# Patient Record
Sex: Female | Born: 1985 | Race: Black or African American | Hispanic: No | Marital: Single | Smoking: Never smoker
Health system: Southern US, Community
[De-identification: ages and names within clinical notes are randomized; demographics above are authoritative.]

## PROBLEM LIST (undated history)

## (undated) DIAGNOSIS — O039 Complete or unspecified spontaneous abortion without complication: Secondary | ICD-10-CM

---

## 2014-12-16 ENCOUNTER — Emergency Department
Admission: EM | Admit: 2014-12-16 | Discharge: 2014-12-16 | Disposition: A | Discharge status: Home / Self Care | Payer: Medicaid - Out of State | Attending: Emergency Medicine | Admitting: Emergency Medicine

## 2014-12-16 DIAGNOSIS — Z3A01 Less than 8 weeks gestation of pregnancy: Secondary | ICD-10-CM | POA: Insufficient documentation

## 2014-12-16 DIAGNOSIS — Z3490 Encounter for supervision of normal pregnancy, unspecified, unspecified trimester: Secondary | ICD-10-CM

## 2014-12-16 DIAGNOSIS — O210 Mild hyperemesis gravidarum: Secondary | ICD-10-CM | POA: Insufficient documentation

## 2014-12-16 LAB — URINALYSIS COMPLETE WITH MICROSCOPIC (ARMC ONLY)
BILIRUBIN URINE: NEGATIVE
Bacteria, UA: NONE SEEN
Glucose, UA: NEGATIVE mg/dL
LEUKOCYTES UA: NEGATIVE
Nitrite: NEGATIVE
PH: 6 (ref 5.0–8.0)
PROTEIN: NEGATIVE mg/dL
Specific Gravity, Urine: 1.026 (ref 1.005–1.030)

## 2014-12-16 LAB — COMPREHENSIVE METABOLIC PANEL
ALT: 30 U/L (ref 14–54)
ANION GAP: 9 (ref 5–15)
AST: 25 U/L (ref 15–41)
Albumin: 4.3 g/dL (ref 3.5–5.0)
Alkaline Phosphatase: 81 U/L (ref 38–126)
BUN: 12 mg/dL (ref 6–20)
CHLORIDE: 102 mmol/L (ref 101–111)
CO2: 25 mmol/L (ref 22–32)
Calcium: 9.3 mg/dL (ref 8.9–10.3)
Creatinine, Ser: 0.68 mg/dL (ref 0.44–1.00)
Glucose, Bld: 89 mg/dL (ref 65–99)
Potassium: 3.6 mmol/L (ref 3.5–5.1)
SODIUM: 136 mmol/L (ref 135–145)
Total Bilirubin: 0.5 mg/dL (ref 0.3–1.2)
Total Protein: 7.9 g/dL (ref 6.5–8.1)

## 2014-12-16 LAB — CBC
HCT: 37.5 % (ref 35.0–47.0)
HEMOGLOBIN: 12.6 g/dL (ref 12.0–16.0)
MCH: 30.3 pg (ref 26.0–34.0)
MCHC: 33.6 g/dL (ref 32.0–36.0)
MCV: 90.2 fL (ref 80.0–100.0)
Platelets: 359 10*3/uL (ref 150–440)
RBC: 4.16 MIL/uL (ref 3.80–5.20)
RDW: 13.1 % (ref 11.5–14.5)
WBC: 15.7 10*3/uL — AB (ref 3.6–11.0)

## 2014-12-16 LAB — POCT PREGNANCY, URINE: Preg Test, Ur: POSITIVE — AB

## 2014-12-16 LAB — LIPASE, BLOOD: LIPASE: 54 U/L — AB (ref 11–51)

## 2014-12-16 MED ORDER — DOXYLAMINE-PYRIDOXINE 10-10 MG PO TBEC: 2.0000 | DELAYED_RELEASE_TABLET | Freq: Every day | ORAL | Status: AC

## 2014-12-16 NOTE — ED Notes (Signed)
Pt reports she has been nauseated since flying her from [place] three days ago. Pt reports she took a pregnancy test and it was positive. Pt denies vomiting. Pt also reports having a hard time sleeping.

## 2014-12-16 NOTE — ED Provider Notes (Signed)
[facility] Emergency Department Provider Note  ____________________________________________  Time seen: On arrival  I have reviewed the triage vital signs and the nursing notes.   HISTORY  Chief Complaint Nausea    HPI Natalie Farrell is a 29 y.o. female who who is here for confirmation of pregnancy. She reports that she has been nauseous for the past few days so she took a pregnancy test which was positive. She denies abdominal pain or vaginal bleeding. She is requesting medication for nausea. No fevers or chills. No dysuria. No vaginal discharge    No past medical history on file.  There are no active problems to display for this patient.   No past surgical history on file.  Current Outpatient Rx  Name  Route  Sig  Dispense  Refill  . Doxylamine-Pyridoxine 10-10 MG TBEC   Oral   Take 2 tablets by mouth at bedtime.   20 tablet   0     Allergies Review of patient's allergies indicates no known allergies.  No family history on file.  Social History Social History  Substance Use Topics  . Smoking status: Not on file  . Smokeless tobacco: Not on file  . Alcohol Use: Not on file    Review of Systems  Constitutional: Negative for fever. Eyes: Negative for visual changes. ENT: Negative for sore throat Genitourinary: Negative for dysuria. Musculoskeletal: Negative for back pain. Skin: Negative for rash. Neurological: Negative for headaches or focal weakness   ____________________________________________   PHYSICAL EXAM:  VITAL SIGNS: ED Triage Vitals  Enc Vitals Group     BP 12/16/14 1912 142/89 mmHg     Pulse Rate 12/16/14 1912 80     Resp 12/16/14 1912 18     Temp 12/16/14 1912 98.6 F (37 C)     Temp Source 12/16/14 1912 Oral     SpO2 12/16/14 1912 100 %     Weight 12/16/14 1912 143 lb (64.864 kg)     Height 12/16/14 1912  (1.626 m)     Head Cir --      Peak Flow --      Pain Score 12/16/14 1913 4     Pain Loc  --      Pain Edu? --      Excl. in GC? --      Constitutional: Alert and oriented. Well appearing and in no distress. Eyes: Conjunctivae are normal.  ENT   Head: Normocephalic and atraumatic.   Mouth/Throat: Mucous membranes are moist. Cardiovascular: Normal rate, regular rhythm.  Respiratory: Normal respiratory effort without tachypnea nor retractions.  Gastrointestinal: Soft and non-tender in all quadrants. No distention. There is no CVA tenderness. Musculoskeletal: Nontender with normal range of motion in all extremities. Neurologic:  Normal speech and language. No gross focal neurologic deficits are appreciated. Skin:  Skin is warm, dry and intact. No rash noted. Psychiatric: Mood and affect are normal. Patient exhibits appropriate insight and judgment.  ____________________________________________    LABS (pertinent positives/negatives)  Labs Reviewed  LIPASE, BLOOD - Abnormal; Notable for the following:    Lipase 54 (*)    All other components within normal limits  CBC - Abnormal; Notable for the following:    WBC 15.7 (*)    All other components within normal limits  URINALYSIS COMPLETEWITH MICROSCOPIC ([facility] ONLY) - Abnormal; Notable for the following:    Color, Urine YELLOW (*)    APPearance CLEAR (*)    Ketones, ur TRACE (*)    Hgb  urine dipstick 1+ (*)    Squamous Epithelial / LPF 0-5 (*)    All other components within normal limits  POCT PREGNANCY, URINE - Abnormal; Notable for the following:    Preg Test, Ur POSITIVE (*)    All other components within normal limits  COMPREHENSIVE METABOLIC PANEL  POC URINE PREG, ED    ____________________________________________     ____________________________________________    RADIOLOGY I have personally reviewed any xrays that were ordered on this patient: None ____________________________________________   PROCEDURES  Procedure(s) performed:  none   ____________________________________________   INITIAL IMPRESSION / ASSESSMENT AND PLAN / ED COURSE  Pertinent labs & imaging results that were available during my care of the patient were reviewed by me and considered in my medical decision making (see chart for details).  Pregnancy confirmed on urine pregnancy test. Patient has no tenderness palpation on exam. She does have a mildly elevated lipase but again no tenderness in the epigastrium and her elevated white blood cell count is likely related to pregnancy. No evidence of infectious etiology  We will prescribe doxylamine for nausea. Recommend follow-up with OB  ____________________________________________   FINAL CLINICAL IMPRESSION(S) / ED DIAGNOSES  Final diagnoses:  Pregnancy  Morning sickness     Jene Everyobert Daison Braxton, MD 12/16/14 2109

## 2014-12-16 NOTE — Discharge Instructions (Signed)
Based on your last menstrual cycle your approximately 5 weeks and 4 days pregnant. Please start taking a prenatal vitamin and avoid cigarettes and alcohol  First Trimester of Pregnancy The first trimester of pregnancy is from week 1 until the end of week 12 (months 1 through 3). A week after a sperm fertilizes an egg, the egg will implant on the wall of the uterus. This embryo will begin to develop into a baby. Genes from you and your partner are forming the baby. The female genes determine whether the baby is a boy or a girl. At 6-8 weeks, the eyes and face are formed, and the heartbeat can be seen on ultrasound. At the end of 12 weeks, all the baby's organs are formed.  Now that you are pregnant, you will want to do everything you can to have a healthy baby. Two of the most important things are to get good prenatal care and to follow your health care provider's instructions. Prenatal care is all the medical care you receive before the baby's birth. This care will help prevent, find, and treat any problems during the pregnancy and childbirth. BODY CHANGES Your body goes through many changes during pregnancy. The changes vary from woman to woman.   You may gain or lose a couple of pounds at first.  You may feel sick to your stomach (nauseous) and throw up (vomit). If the vomiting is uncontrollable, call your health care provider.  You may tire easily.  You may develop headaches that can be relieved by medicines approved by your health care provider.  You may urinate more often. Painful urination may mean you have a bladder infection.  You may develop heartburn as a result of your pregnancy.  You may develop constipation because certain hormones are causing the muscles that push waste through your intestines to slow down.  You may develop hemorrhoids or swollen, bulging veins (varicose veins).  Your breasts may begin to grow larger and become tender. Your nipples may stick out more, and the  tissue that surrounds them (areola) may become darker.  Your gums may bleed and may be sensitive to brushing and flossing.  Dark spots or blotches (chloasma, mask of pregnancy) may develop on your face. This will likely fade after the baby is born.  Your menstrual periods will stop.  You may have a loss of appetite.  You may develop cravings for certain kinds of food.  You may have changes in your emotions from day to day, such as being excited to be pregnant or being concerned that something may go wrong with the pregnancy and baby.  You may have more vivid and strange dreams.  You may have changes in your hair. These can include thickening of your hair, rapid growth, and changes in texture. Some women also have hair loss during or after pregnancy, or hair that feels dry or thin. Your hair will most likely return to normal after your baby is born. WHAT TO EXPECT AT YOUR PRENATAL VISITS During a routine prenatal visit:  You will be weighed to make sure you and the baby are growing normally.  Your blood pressure will be taken.  Your abdomen will be measured to track your baby's growth.  The fetal heartbeat will be listened to starting around week 10 or 12 of your pregnancy.  Test results from any previous visits will be discussed. Your health care provider may ask you:  How you are feeling.  If you are feeling the baby move.  If you have had any abnormal symptoms, such as leaking fluid, bleeding, severe headaches, or abdominal cramping.  If you are using any tobacco products, including cigarettes, chewing tobacco, and electronic cigarettes.  If you have any questions. Other tests that may be performed during your first trimester include:  Blood tests to find your blood type and to check for the presence of any previous infections. They will also be used to check for low iron levels (anemia) and Rh antibodies. Later in the pregnancy, blood tests for diabetes will be done along  with other tests if problems develop.  Urine tests to check for infections, diabetes, or protein in the urine.  An ultrasound to confirm the proper growth and development of the baby.  An amniocentesis to check for possible genetic problems.  Fetal screens for spina bifida and Down syndrome.  You may need other tests to make sure you and the baby are doing well.  HIV (human immunodeficiency virus) testing. Routine prenatal testing includes screening for HIV, unless you choose not to have this test. HOME CARE INSTRUCTIONS  Medicines  Follow your health care provider's instructions regarding medicine use. Specific medicines may be either safe or unsafe to take during pregnancy.  Take your prenatal vitamins as directed.  If you develop constipation, try taking a stool softener if your health care provider approves. Diet  Eat regular, well-balanced meals. Choose a variety of foods, such as meat or vegetable-based protein, fish, milk and low-fat dairy products, vegetables, fruits, and whole grain breads and cereals. Your health care provider will help you determine the amount of weight gain that is right for you.  Avoid raw meat and uncooked cheese. These carry germs that can cause birth defects in the baby.  Eating four or five small meals rather than three large meals a day may help relieve nausea and vomiting. If you start to feel nauseous, eating a few soda crackers can be helpful. Drinking liquids between meals instead of during meals also seems to help nausea and vomiting.  If you develop constipation, eat more high-fiber foods, such as fresh vegetables or fruit and whole grains. Drink enough fluids to keep your urine clear or pale yellow. Activity and Exercise  Exercise only as directed by your health care provider. Exercising will help you:  Control your weight.  Stay in shape.  Be prepared for labor and delivery.  Experiencing pain or cramping in the lower abdomen or low  back is a good sign that you should stop exercising. Check with your health care provider before continuing normal exercises.  Try to avoid standing for long periods of time. Move your legs often if you must stand in one place for a long time.  Avoid heavy lifting.  Wear low-heeled shoes, and practice good posture.  You may continue to have sex unless your health care provider directs you otherwise. Relief of Pain or Discomfort  Wear a good support bra for breast tenderness.   Take warm sitz baths to soothe any pain or discomfort caused by hemorrhoids. Use hemorrhoid cream if your health care provider approves.   Rest with your legs elevated if you have leg cramps or low back pain.  If you develop varicose veins in your legs, wear support hose. Elevate your feet for 15 minutes, 3-4 times a day. Limit salt in your diet. Prenatal Care  Schedule your prenatal visits by the twelfth week of pregnancy. They are usually scheduled monthly at first, then more often in the last  2 months before delivery.  Write down your questions. Take them to your prenatal visits.  Keep all your prenatal visits as directed by your health care provider. Safety  Wear your seat belt at all times when driving.  Make a list of emergency phone numbers, including numbers for family, friends, the hospital, and police and fire departments. General Tips  Ask your health care provider for a referral to a local prenatal education class. Begin classes no later than at the beginning of month 6 of your pregnancy.  Ask for help if you have counseling or nutritional needs during pregnancy. Your health care provider can offer advice or refer you to specialists for help with various needs.  Do not use hot tubs, steam rooms, or saunas.  Do not douche or use tampons or scented sanitary pads.  Do not cross your legs for long periods of time.  Avoid cat litter boxes and soil used by cats. These carry germs that can cause  birth defects in the baby and possibly loss of the fetus by miscarriage or stillbirth.  Avoid all smoking, herbs, alcohol, and medicines not prescribed by your health care provider. Chemicals in these affect the formation and growth of the baby.  Do not use any tobacco products, including cigarettes, chewing tobacco, and electronic cigarettes. If you need help quitting, ask your health care provider. You may receive counseling support and other resources to help you quit.  Schedule a dentist appointment. At home, brush your teeth with a soft toothbrush and be gentle when you floss. SEEK MEDICAL CARE IF:   You have dizziness.  You have mild pelvic cramps, pelvic pressure, or nagging pain in the abdominal area.  You have persistent nausea, vomiting, or diarrhea.  You have a bad smelling vaginal discharge.  You have pain with urination.  You notice increased swelling in your face, hands, legs, or ankles. SEEK IMMEDIATE MEDICAL CARE IF:   You have a fever.  You are leaking fluid from your vagina.  You have spotting or bleeding from your vagina.  You have severe abdominal cramping or pain.  You have rapid weight gain or loss.  You vomit blood or material that looks like coffee grounds.  You are exposed to [place] measles and have never had them.  You are exposed to fifth disease or chickenpox.  You develop a severe headache.  You have shortness of breath.  You have any kind of trauma, such as from a fall or a car accident.   This information is not intended to replace advice given to you by your health care provider. Make sure you discuss any questions you have with your health care provider.   Document Released: 01/22/2001 Document Revised: 02/18/2014 Document Reviewed: 12/08/2012 Elsevier Interactive Patient Education [employer]2016 Elsevier Inc.

## 2014-12-28 ENCOUNTER — Emergency Department
Admission: EM | Admit: 2014-12-28 | Discharge: 2014-12-28 | Disposition: A | Discharge status: Home / Self Care | Payer: Medicaid - Out of State | Attending: Emergency Medicine | Admitting: Emergency Medicine

## 2014-12-28 DIAGNOSIS — O210 Mild hyperemesis gravidarum: Secondary | ICD-10-CM | POA: Insufficient documentation

## 2014-12-28 DIAGNOSIS — Z3A01 Less than 8 weeks gestation of pregnancy: Secondary | ICD-10-CM | POA: Diagnosis not present

## 2014-12-28 DIAGNOSIS — O209 Hemorrhage in early pregnancy, unspecified: Secondary | ICD-10-CM | POA: Insufficient documentation

## 2014-12-28 DIAGNOSIS — O219 Vomiting of pregnancy, unspecified: Secondary | ICD-10-CM

## 2014-12-28 DIAGNOSIS — O9989 Other specified diseases and conditions complicating pregnancy, childbirth and the puerperium: Secondary | ICD-10-CM | POA: Diagnosis present

## 2014-12-28 HISTORY — DX: Complete or unspecified spontaneous abortion without complication: O03.9

## 2014-12-28 LAB — URINALYSIS COMPLETE WITH MICROSCOPIC (ARMC ONLY)
BACTERIA UA: NONE SEEN
Bilirubin Urine: NEGATIVE
Glucose, UA: NEGATIVE mg/dL
HGB URINE DIPSTICK: NEGATIVE
LEUKOCYTES UA: NEGATIVE
Nitrite: NEGATIVE
PH: 6 (ref 5.0–8.0)
PROTEIN: 30 mg/dL — AB
Specific Gravity, Urine: 1.03 (ref 1.005–1.030)

## 2014-12-28 LAB — COMPREHENSIVE METABOLIC PANEL
ALT: 56 U/L — AB (ref 14–54)
AST: 32 U/L (ref 15–41)
Albumin: 4.2 g/dL (ref 3.5–5.0)
Alkaline Phosphatase: 106 U/L (ref 38–126)
Anion gap: 10 (ref 5–15)
BUN: 11 mg/dL (ref 6–20)
CHLORIDE: 103 mmol/L (ref 101–111)
CO2: 22 mmol/L (ref 22–32)
CREATININE: 0.57 mg/dL (ref 0.44–1.00)
Calcium: 9.4 mg/dL (ref 8.9–10.3)
GFR calc non Af Amer: >60 mL/min (ref >60)
Glucose, Bld: 88 mg/dL (ref 65–99)
POTASSIUM: 4 mmol/L (ref 3.5–5.1)
SODIUM: 135 mmol/L (ref 135–145)
Total Bilirubin: 0.7 mg/dL (ref 0.3–1.2)
Total Protein: 7.6 g/dL (ref 6.5–8.1)

## 2014-12-28 LAB — CBC
HEMATOCRIT: 38.8 % (ref 35.0–47.0)
Hemoglobin: 12.9 g/dL (ref 12.0–16.0)
MCH: 29.8 pg (ref 26.0–34.0)
MCHC: 33.3 g/dL (ref 32.0–36.0)
MCV: 89.5 fL (ref 80.0–100.0)
PLATELETS: 360 10*3/uL (ref 150–440)
RBC: 4.34 MIL/uL (ref 3.80–5.20)
RDW: 13.3 % (ref 11.5–14.5)
WBC: 15.7 10*3/uL — ABNORMAL HIGH (ref 3.6–11.0)

## 2014-12-28 LAB — CHLAMYDIA/NGC RT PCR (ARMC ONLY)
CHLAMYDIA TR: NOT DETECTED
N gonorrhoeae: NOT DETECTED

## 2014-12-28 LAB — HCG, QUANTITATIVE, PREGNANCY: HCG, BETA CHAIN, QUANT, S: 208083 m[IU]/mL — AB (ref <5)

## 2014-12-28 MED ORDER — DEXTROSE 5 % AND 0.9 % NACL IV BOLUS
1000.0000 mL | Freq: Once | INTRAVENOUS | Status: AC
  Administered 2014-12-28: 1000 mL via INTRAVENOUS

## 2014-12-28 MED ORDER — PROMETHAZINE HCL 25 MG PO TABS: 25.0000 mg | ORAL_TABLET | Freq: Four times a day (QID) | ORAL | Status: AC | PRN

## 2014-12-28 MED ORDER — PROMETHAZINE HCL 25 MG PO TABS
50.0000 mg | ORAL_TABLET | Freq: Once | ORAL | Status: AC
  Administered 2014-12-28: 50 mg via ORAL
  Filled 2014-12-28: qty 2

## 2014-12-28 MED ORDER — ONDANSETRON HCL 4 MG/2ML IJ SOLN
4.0000 mg | Freq: Once | INTRAMUSCULAR | Status: AC
  Administered 2014-12-28: 4 mg via INTRAVENOUS
  Filled 2014-12-28: qty 2

## 2014-12-28 NOTE — ED Provider Notes (Addendum)
[facility] Emergency Department Provider Note     Time seen: ----------------------------------------- 10:52 AM on 12/28/2014 -----------------------------------------    I have reviewed the triage vital signs and the nursing notes.   HISTORY  Chief Complaint Abdominal Pain    HPI Natalie Farrell is a 29 y.o. female who presents ER for frequent vomiting and dizziness. Patient states she  Has had significant vomiting with poor by mouth tolerance. She has at the point now where she feels dizzy, she does not know exactly how far along she is, states 2 prior pregnancies had similar hyperemesis. Patient denies any fevers, chills, chest pain. Patient states she does have some vaginal discharge throughout the pregnancy.   Past Medical History  Diagnosis Date  . Miscarriage     There are no active problems to display for this patient.   History reviewed. No pertinent past surgical history.  Allergies Review of patient's allergies indicates no known allergies.  Social History Social History  Substance Use Topics  . Smoking status: Never Smoker   . Smokeless tobacco: None  . Alcohol Use: No    Review of Systems Constitutional: Negative for fever. Eyes: Negative for visual changes. ENT: Negative for sore throat. Cardiovascular: Negative for chest pain. Respiratory: Negative for shortness of breath. Gastrointestinal: Negative for abdominal pain, positive for vomiting Genitourinary: Negative for dysuria, positive for vaginal discharge  Musculoskeletal: Negative for back pain. Skin: Negative for rash. Neurological: Negative for headaches, focal weakness or numbness. positive for dizziness   10-point ROS otherwise negative.  ____________________________________________   PHYSICAL EXAM:  VITAL SIGNS: ED Triage Vitals  Enc Vitals Group     BP 12/28/14 0859 125/82 mmHg     Pulse Rate 12/28/14 0859 107     Resp 12/28/14 0859 20     Temp  12/28/14 0859 98.7 F (37.1 C)     Temp Source 12/28/14 0859 Oral     SpO2 12/28/14 0859 99 %     Weight --      Height 12/28/14 0859 5\' 4"  (1.626 m)     Head Cir --      Peak Flow --      Pain Score 12/28/14 0901 4     Pain Loc --      Pain Edu? --      Excl. in GC? --     Constitutional: Alert and oriented. Well appearing and in no distress. Eyes: Conjunctivae are normal. PERRL. Normal extraocular movements. ENT   Head: Normocephalic and atraumatic.   Nose: No congestion/rhinnorhea.   Mouth/Throat: Mucous membranes are moist.   Neck: No stridor. Cardiovascular: Normal rate, regular rhythm. Normal and symmetric distal pulses are present in all extremities. No murmurs, rubs, or gallops. Respiratory: Normal respiratory effort without tachypnea nor retractions. Breath sounds are clear and equal bilaterally. No wheezes/rales/rhonchi. Gastrointestinal: Soft and nontender. No distention. No abdominal bruits.  Musculoskeletal: Nontender with normal range of motion in all extremities. No joint effusions.  No lower extremity tenderness nor edema. Neurologic:  Normal speech and language. No gross focal neurologic deficits are appreciated. Speech is normal. No gait instability. Skin:  Skin is warm, dry and intact. No rash noted. Psychiatric: Mood and affect are normal. Speech and behavior are normal. Patient exhibits appropriate insight and judgment. ____________________________________________  ED COURSE:  Pertinent labs & imaging results that were available during my care of the patient were reviewed by me and considered in my medical decision making (see chart for details).  patient's in no  acute distress, will received D5 normal saline bolus, IV Zofran and reevaluation. ____________________________________________    LABS (pertinent positives/negatives)  Labs Reviewed  CBC - Abnormal; Notable for the following:    WBC 15.7 (*)    All other components within normal  limits  URINALYSIS COMPLETEWITH MICROSCOPIC ([facility] ONLY) - Abnormal; Notable for the following:    Color, Urine AMBER (*)    APPearance CLEAR (*)    Ketones, ur TRACE (*)    Protein, ur 30 (*)    Squamous Epithelial / LPF 6-30 (*)    All other components within normal limits  HCG, QUANTITATIVE, PREGNANCY - Abnormal; Notable for the following:    hCG, Beta Francene Finders [id number] (*)    All other components within normal limits  CHLAMYDIA/NGC RT PCR ([facility] ONLY)  COMPREHENSIVE METABOLIC PANEL  ____________________________________________  FINAL ASSESSMENT AND PLAN  Impression: Hyperemesis gravidarum  Plan: Patient with labs and imaging as dictated abopatient is feeling better after saline boluses and IV antiemetics. She'll be discharged with antiemetics and encouraged to have close follow-up with her OB/GYN doctor.   Emily Filbert, MD   Emily Filbert, MD 12/28/14 1055  Emily Filbert, MD 12/28/14 [phone]

## 2014-12-28 NOTE — ED Notes (Signed)
Pt states she is approximately [redacted] weeks pregnant and has had nausea, began having lower abd pain last night.. States [place] is [place] from [place]..Marland Kitchen

## 2014-12-28 NOTE — Discharge Instructions (Signed)
Hyperemesis Gravidarum  Hyperemesis gravidarum is a severe form of nausea and vomiting that happens during pregnancy. Hyperemesis is worse than morning sickness. It may cause you to have nausea or vomiting all day for many days. It may keep you from eating and drinking enough food and liquids. Hyperemesis usually occurs during the first half (the first 20 weeks) of pregnancy. It often goes away once a woman is in her second half of pregnancy. However, sometimes hyperemesis continues through an entire pregnancy.   CAUSES   The cause of this condition is not completely known but is thought to be related to changes in the body's hormones when pregnant. It could be from the high level of the pregnancy hormone or an increase in estrogen in the body.   SIGNS AND SYMPTOMS    Severe nausea and vomiting.   Nausea that does not go away.   Vomiting that does not allow you to keep any food down.   Weight loss and body fluid loss (dehydration).   Having no desire to eat or not liking food you have previously enjoyed.  DIAGNOSIS   Your health care provider will do a physical exam and ask you about your symptoms. He or she may also order blood tests and urine tests to make sure something else is not causing the problem.   TREATMENT   You may only need medicine to control the problem. If medicines do not control the nausea and vomiting, you will be treated in the hospital to prevent dehydration, increased acid in the blood (acidosis), weight loss, and changes in the electrolytes in your body that may harm the unborn baby (fetus). You may need IV fluids.   HOME CARE INSTRUCTIONS    Only take over-the-counter or prescription medicines as directed by your health care provider.   Try eating a couple of dry crackers or toast in the morning before getting out of bed.   Avoid foods and smells that upset your stomach.   Avoid fatty and spicy foods.   Eat 5-6 small meals a day.   Do not drink when eating meals. Drink between  meals.   For snacks, eat high-protein foods, such as cheese.   Eat or suck on things that have ginger in them. Ginger helps nausea.   Avoid food preparation. The smell of food can spoil your appetite.   Avoid iron pills and iron in your multivitamins until after 3-4 months of being pregnant. However, consult with your health care provider before stopping any prescribed iron pills.  SEEK MEDICAL CARE IF:    Your abdominal pain increases.   You have a severe headache.   You have vision problems.   You are losing weight.  SEEK IMMEDIATE MEDICAL CARE IF:    You are unable to keep fluids down.   You vomit blood.   You have constant nausea and vomiting.   You have excessive weakness.   You have extreme thirst.   You have dizziness or fainting.   You have a fever or persistent symptoms for more than 2-3 days.   You have a fever and your symptoms suddenly get worse.  MAKE SURE YOU:    Understand these instructions.   Will watch your condition.   Will get help right away if you are not doing well or get worse.     This information is not intended to replace advice given to you by your health care provider. Make sure you discuss any questions you have with   your health care provider.     Document Released: 01/28/2005 Document Revised: 11/18/2012 Document Reviewed: 09/09/2012  Elsevier Interactive Patient Education 2016 Elsevier Inc.

## 2015-01-13 ENCOUNTER — Emergency Department: Payer: Medicaid - Out of State

## 2015-01-13 ENCOUNTER — Emergency Department
Admission: EM | Admit: 2015-01-13 | Discharge: 2015-01-13 | Disposition: A | Discharge status: Home / Self Care | Payer: Medicaid - Out of State | Attending: Emergency Medicine | Admitting: Emergency Medicine

## 2015-01-13 ENCOUNTER — Encounter: Payer: Self-pay | Admitting: *Deleted

## 2015-01-13 DIAGNOSIS — Z3A10 10 weeks gestation of pregnancy: Secondary | ICD-10-CM | POA: Diagnosis not present

## 2015-01-13 DIAGNOSIS — O9989 Other specified diseases and conditions complicating pregnancy, childbirth and the puerperium: Secondary | ICD-10-CM | POA: Insufficient documentation

## 2015-01-13 DIAGNOSIS — Z79899 Other long term (current) drug therapy: Secondary | ICD-10-CM | POA: Insufficient documentation

## 2015-01-13 DIAGNOSIS — R911 Solitary pulmonary nodule: Secondary | ICD-10-CM | POA: Diagnosis not present

## 2015-01-13 DIAGNOSIS — R079 Chest pain, unspecified: Secondary | ICD-10-CM

## 2015-01-13 LAB — FIBRIN DERIVATIVES D-DIMER (ARMC ONLY): FIBRIN DERIVATIVES D-DIMER (ARMC): 1084 — AB (ref 0–499)

## 2015-01-13 LAB — BASIC METABOLIC PANEL
Anion gap: 5 (ref 5–15)
BUN: 11 mg/dL (ref 6–20)
CALCIUM: 9.2 mg/dL (ref 8.9–10.3)
CO2: 26 mmol/L (ref 22–32)
CREATININE: 0.52 mg/dL (ref 0.44–1.00)
Chloride: 103 mmol/L (ref 101–111)
GFR calc Af Amer: >60 mL/min (ref >60)
Glucose, Bld: 107 mg/dL — ABNORMAL HIGH (ref 65–99)
POTASSIUM: 4.1 mmol/L (ref 3.5–5.1)
SODIUM: 134 mmol/L — AB (ref 135–145)

## 2015-01-13 LAB — CBC
HCT: 36.1 % (ref 35.0–47.0)
Hemoglobin: 11.9 g/dL — ABNORMAL LOW (ref 12.0–16.0)
MCH: 29.4 pg (ref 26.0–34.0)
MCHC: 32.9 g/dL (ref 32.0–36.0)
MCV: 89.4 fL (ref 80.0–100.0)
PLATELETS: 338 10*3/uL (ref 150–440)
RBC: 4.03 MIL/uL (ref 3.80–5.20)
RDW: 12.2 % (ref 11.5–14.5)
WBC: 14.1 10*3/uL — AB (ref 3.6–11.0)

## 2015-01-13 LAB — BRAIN NATRIURETIC PEPTIDE: B Natriuretic Peptide: 26 pg/mL (ref 0.0–100.0)

## 2015-01-13 LAB — TROPONIN I: Troponin I: <0.03 ng/mL (ref <0.031)

## 2015-01-13 MED ORDER — IOHEXOL 350 MG/ML SOLN
75.0000 mL | Freq: Once | INTRAVENOUS | Status: AC | PRN
  Administered 2015-01-13: 75 mL via INTRAVENOUS

## 2015-01-13 MED ORDER — SODIUM CHLORIDE 0.9 % IV BOLUS (SEPSIS)
1000.0000 mL | Freq: Once | INTRAVENOUS | Status: AC
  Administered 2015-01-13: 1000 mL via INTRAVENOUS

## 2015-01-13 MED ORDER — ACETAMINOPHEN-CODEINE #3 300-30 MG PO TABS
2.0000 | ORAL_TABLET | Freq: Once | ORAL | Status: AC
  Administered 2015-01-13: 2 via ORAL
  Filled 2015-01-13: qty 2

## 2015-01-13 NOTE — ED Notes (Signed)
Patient states every time she feels the pain come on her heart rate increases.

## 2015-01-13 NOTE — ED Notes (Addendum)
Patient transported to ultrasound.

## 2015-01-13 NOTE — Discharge Instructions (Signed)
You have been seen in the Emergency Department (ED) today for chest pain.  As we have discussed todays test results are normal, but you may require further testing.  Please follow up with [facility] as instructed above in these documents regarding todays emergent visit and your recent symptoms to discuss further management.    Return to the Emergency Department (ED) if you experience any further chest pain/pressure/tightness, difficulty breathing, or sudden sweating, or other symptoms that concern you.  You also have a nodule noted in your lung, and because you are previous smoker we recommend follow-up with your primary care doctor to set up a repeat CT scan of the chest for further evaluation in one years time when not pregnant.   Chest Pain Observation It is often hard to give a specific diagnosis for the cause of chest pain. Among other possibilities your symptoms might be caused by inadequate oxygen delivery to your heart (angina). Angina that is not treated or evaluated can lead to a heart attack (myocardial infarction) or death. Blood tests, electrocardiograms, and X-rays may have been done to help determine a possible cause of your chest pain. After evaluation and observation, your health care provider has determined that it is unlikely your pain was caused by an unstable condition that requires hospitalization. However, a full evaluation of your pain may need to be completed, with additional diagnostic testing as directed. It is very important to keep your follow-up appointments. Not keeping your follow-up appointments could result in permanent heart damage, disability, or death. If there is any problem keeping your follow-up appointments, you must call your health care provider. HOME CARE INSTRUCTIONS  Due to the slight chance that your pain could be angina, it is important to follow your health care provider's treatment plan and also maintain a healthy lifestyle:  Maintain or work  toward achieving a healthy weight.  Stay physically active and exercise regularly.  Decrease your salt intake.  Eat a balanced, healthy diet. Talk to a dietitian to learn about heart-healthy foods.  Increase your fiber intake by including whole grains, vegetables, fruits, and nuts in your diet.  Avoid situations that cause stress, anger, or depression.  Take medicines as advised by your health care provider. Report any side effects to your health care provider. Do not stop medicines or adjust the dosages on your own.  Quit smoking. Do not use nicotine patches or gum until you check with your health care provider.  Keep your blood pressure, blood sugar, and cholesterol levels within normal limits.  Limit alcohol intake to no more than 1 drink per day for women who are not pregnant and 2 drinks per day for men.  Do not abuse drugs. SEEK IMMEDIATE MEDICAL CARE IF: You have severe chest pain or pressure which may include symptoms such as:  You feel pain or pressure in your arms, neck, jaw, or back.  You have severe back or abdominal pain, feel sick to your stomach (nauseous), or throw up (vomit).  You are sweating profusely.  You are having a fast or irregular heartbeat.  You feel short of breath while at rest.  You notice increasing shortness of breath during rest, sleep, or with activity.  You have chest pain that does not get better after rest or after taking your usual medicine.  You wake from sleep with chest pain.  You are unable to sleep because you cannot breathe.  You develop a frequent cough or you are coughing up blood.  You feel  dizzy, faint, or experience extreme fatigue.  You develop severe weakness, dizziness, fainting, or chills. Any of these symptoms may represent a serious problem that is an emergency. Do not wait to see if the symptoms will go away. Call your local emergency services (911 in the [place] Do not drive yourself to the hospital. MAKE SURE  YOU:  Understand these instructions.  Will watch your condition.  Will get help right away if you are not doing well or get worse.   This information is not intended to replace advice given to you by your health care provider. Make sure you discuss any questions you have with your health care provider.   Document Released: 03/02/2010 Document Revised: 02/02/2013 Document Reviewed: 07/30/2012 Elsevier Interactive Patient Education [employer]2016 [employer] Inc.

## 2015-01-13 NOTE — ED Notes (Signed)
Patient transported to CT 

## 2015-01-13 NOTE — ED Notes (Signed)
Patient states she was recently dx with a UTI. Patient has been taking cefadroxil 500mg  x2 daily since Monday, 11/28.

## 2015-01-13 NOTE — ED Provider Notes (Addendum)
[facility] Emergency Department Provider Note REMINDER - THIS NOTE IS NOT A FINAL MEDICAL RECORD UNTIL IT IS SIGNED. UNTIL THEN, THE CONTENT BELOW MAY REFLECT INFORMATION FROM A DOCUMENTATION TEMPLATE, NOT THE ACTUAL PATIENT VISIT. ____________________________________________  Time seen: Approximately 4:08 PM  I have reviewed the triage vital signs and the nursing notes.   HISTORY  Chief Complaint Chest Pain    HPI Natalie Farrell is a 29 y.o. 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  The patient reports that for about the last 3 weeks she's been experiencing intermittent chest pain which is achy, occasionally stabbing in the chest. This is in the middle of her chest, and it comes and goes. No changes been noted with laying flat or exertion. She does not report any leg swelling. She has not had any fevers chills or cough. At the present time she is not having a chest pain. She saw her nurse at her job site who advised her to come emergency room for evaluation.  At this time she is asymptomatic. She does report having had nausea during the pregnancy which is improved with Phenergan and also taking Tylenol 3 at times for general discomfort as prescribed previously.  She is about [redacted] weeks pregnant per her report.  No abdominal pain, vaginal bleeding or pelvic discomfort. Denies shortness of breath. She reports that her heart rate at work was notably going up and down at times for seemingly no reason.   Past Medical History  Diagnosis Date  . Miscarriage     There are no active problems to display for this patient.   History reviewed. No pertinent past surgical history.  Current Outpatient Rx  Name  Route  Sig  Dispense  Refill  . Doxylamine-Pyridoxine 10-10 MG TBEC   Oral   Take 2 tablets by mouth at bedtime.   20 tablet   0    . promethazine (PHENERGAN) 25 MG tablet   Oral   Take 1 tablet (25 mg total) by mouth every 6 (six) hours as needed for nausea or vomiting.   30 tablet   0     Allergies Review of patient's allergies indicates no known allergies.  No family history on file.  Social History Social History  Substance Use Topics  . Smoking status: Never Smoker   . Smokeless tobacco: None  . Alcohol Use: No    Review of Systems Constitutional: No fever/chills Eyes: No visual changes. ENT: No sore throat. Cardiovascular: See history of present illness Respiratory: Denies shortness of breath. Gastrointestinal: No abdominal pain.  No vomiting.  No diarrhea.  No constipation. Genitourinary: Negative for dysuria. Musculoskeletal: Negative for back pain. Skin: Negative for rash. Neurological: Negative for headaches, focal weakness or numbness.  10-point ROS otherwise negative.  ____________________________________________   PHYSICAL EXAM:  VITAL SIGNS: ED Triage Vitals  Enc Vitals Group     BP 01/13/15 1508 132/83 mmHg     Pulse Rate 01/13/15 1508 113     Resp 01/13/15 1508 20     Temp 01/13/15 1508 98.4 F (36.9 C)     Temp Source 01/13/15 1508 Oral     SpO2 01/13/15 1508 97 %     Weight 01/13/15 1508 142 lb (64.411 kg)     Height 01/13/15 1508 5\' 4"  (1.626 m)     Head Cir --  Peak Flow --      Pain Score 01/13/15 1509 8     Pain Loc --      Pain Edu? --      Excl. in GC? --    Constitutional: Alert and oriented. Well appearing and in no acute distress. Eyes: Conjunctivae are normal. PERRL. EOMI. Head: Atraumatic. Nose: No congestion/rhinnorhea. Mouth/Throat: Mucous membranes are moist.  Oropharynx non-erythematous. Neck: No stridor.   Cardiovascular: Slightly tachycardic rate, regular rhythm. Grossly normal heart sounds.  Good peripheral circulation. Respiratory: Normal respiratory effort.  No retractions. Lungs CTAB. Gastrointestinal: Soft and nontender. No  distention. No abdominal bruits. No CVA tenderness. Musculoskeletal: No lower extremity tenderness nor edema.  No joint effusions. Neurologic:  Normal speech and language. No gross focal neurologic deficits are appreciated. No gait instability. Skin:  Skin is warm, dry and intact. No rash noted. Psychiatric: Mood and affect are normal. Speech and behavior are normal.  ____________________________________________   LABS (all labs ordered are listed, but only abnormal results are displayed)  Labs Reviewed  BASIC METABOLIC PANEL - Abnormal; Notable for the following:    Sodium 134 (*)    Glucose, Bld 107 (*)    All other components within normal limits  CBC - Abnormal; Notable for the following:    WBC 14.1 (*)    Hemoglobin 11.9 (*)    All other components within normal limits  FIBRIN DERIVATIVES D-DIMER ([facility] ONLY) - Abnormal; Notable for the following:    Fibrin derivatives D-dimer ([facility]) 1084 (*)    All other components within normal limits  TROPONIN I  BRAIN NATRIURETIC PEPTIDE   ____________________________________________  EKG  ED ECG REPORT I, Keithan Dileonardo, the attending physician, personally viewed and interpreted this ECG.  Date: 01/13/2015 EKG Time: 1515 Rate: 110 Rhythm: Sinus tachycardia QRS Axis: normal Intervals: normal ST/T Wave abnormalities: normal Conduction Disutrbances: none Narrative Interpretation: Sinus tachycardia, no ischemic abnormality. No evidence of right heart strain. No S1 Q3 T3.  ____________________________________________  RADIOLOGY   CT Angio Chest PE W/Cm &/Or Wo Cm (Final result) Result time: 01/13/15 19:24:09   Final result by Rad Results In Interface (01/13/15 19:24:09)   Narrative:   CLINICAL DATA: Chest pain. Shortness of breath. First trimester pregnancy.  EXAM: CT ANGIOGRAPHY CHEST WITH CONTRAST  TECHNIQUE: Multidetector CT imaging of the chest was performed using the standard protocol during bolus administration of  intravenous contrast. Multiplanar CT image reconstructions and MIPs were obtained to evaluate the vascular anatomy.  CONTRAST: 75mL OMNIPAQUE IOHEXOL 350 MG/ML SOLN  COMPARISON: Chest radiograph from earlier today.  FINDINGS: Mediastinum/Nodes: The study is high quality for the evaluation of pulmonary embolism. There are no filling defects in the central, lobar, segmental or subsegmental pulmonary artery branches to suggest acute pulmonary embolism. Great vessels are normal in course and caliber. Normal heart size. No pericardial fluid/thickening. Normal visualized thyroid. Normal esophagus. No pathologically enlarged axillary, mediastinal or hilar lymph nodes. There is a small remnant thymus in the anterior mediastinum with internal fatty atrophy.  Lungs/Pleura: No pneumothorax. No pleural effusion. Anterior right middle lobe 4 mm subpleural solid pulmonary nodule (series 6/ image 90). No acute consolidative airspace disease, additional significant pulmonary nodules or lung masses.  Upper abdomen: Unremarkable.  Musculoskeletal: No aggressive appearing focal osseous lesions.  Review of the MIP images confirms the above findings.  IMPRESSION: 1. No pulmonary embolism. 2. Solitary subpleural right middle lobe 4 mm pulmonary nodule, probably a benign subpleural lymph node. If the patient is at high risk  for bronchogenic carcinoma, follow-up chest CT at 1 year is recommended. If the patient is at low risk, no follow-up is needed. This recommendation follows the consensus statement: Guidelines for Management of Small Pulmonary Nodules Detected on CT Scans: A Statement from the Fleischner Society as published in Radiology 2005; 237:395-400. 3. Otherwise no active pulmonary disease.   Electronically Signed By: Delbert Phenix M.D. On: 01/13/2015 19:24          DG Chest 2 View (Final result) Result time: 01/13/15 17:29:25   Final result by Rad Results In Interface  (01/13/15 17:29:25)   Narrative:   CLINICAL DATA: Chest pain and shortness of breath for 2 or 3 weeks. Nine weeks pregnant. Subjective tachycardia. Initial encounter.  EXAM: CHEST 2 VIEW  COMPARISON: None.  FINDINGS: The heart size and mediastinal contours are normal. The lungs are clear. There is no pleural effusion or pneumothorax. No acute osseous findings are identified. Telemetry leads overlie the chest.  IMPRESSION: No active cardiopulmonary process.   Electronically Signed By: Carey Bullocks M.D. On: 01/13/2015 17:29          US Venous Img Lower Bilateral (Final result) Result time: 01/13/15 17:32:35   Final result by Rad Results In Interface (01/13/15 17:32:35)   Narrative:   CLINICAL DATA: Pregnant with chest pain.  EXAM: BILATERAL LOWER EXTREMITY VENOUS DOPPLER ULTRASOUND  TECHNIQUE: Gray-scale sonography with graded compression, as well as color Doppler and duplex ultrasound were performed to evaluate the lower extremity deep venous systems from the level of the common femoral vein and including the common femoral, femoral, profunda femoral, popliteal and calf veins including the posterior tibial, peroneal and gastrocnemius veins when visible. The superficial great saphenous vein was also interrogated. Spectral Doppler was utilized to evaluate flow at rest and with distal augmentation maneuvers in the common femoral, femoral and popliteal veins.  COMPARISON: None.  FINDINGS: RIGHT LOWER EXTREMITY  Common Femoral Vein: No evidence of thrombus. Normal compressibility, respiratory phasicity and response to augmentation.  Saphenofemoral Junction: No evidence of thrombus. Normal compressibility and flow on color Doppler imaging.  Profunda Femoral Vein: No evidence of thrombus. Normal compressibility and flow on color Doppler imaging.  Femoral Vein: No evidence of thrombus. Normal compressibility, respiratory phasicity and response to  augmentation.  Popliteal Vein: No evidence of thrombus. Normal compressibility, respiratory phasicity and response to augmentation.  Calf Veins: No evidence of thrombus. Normal compressibility and flow on color Doppler imaging.  Superficial Great Saphenous Vein: No evidence of thrombus. Normal compressibility and flow on color Doppler imaging.  Venous Reflux: None.  Other Findings: None.  LEFT LOWER EXTREMITY  Common Femoral Vein: No evidence of thrombus. Normal compressibility, respiratory phasicity and response to augmentation.  Saphenofemoral Junction: No evidence of thrombus. Normal compressibility and flow on color Doppler imaging.  Profunda Femoral Vein: No evidence of thrombus. Normal compressibility and flow on color Doppler imaging.  Femoral Vein: No evidence of thrombus. Normal compressibility, respiratory phasicity and response to augmentation.  Popliteal Vein: No evidence of thrombus. Normal compressibility, respiratory phasicity and response to augmentation.  Calf Veins: No evidence of thrombus. Normal compressibility and flow on color Doppler imaging.  Superficial Great Saphenous Vein: No evidence of thrombus. Normal compressibility and flow on color Doppler imaging.  Venous Reflux: None.  Other Findings: None.  IMPRESSION: No evidence of deep venous thrombosis.      ____________________________________________   PROCEDURES  Procedure(s) performed: None  Critical Care performed: No  ____________________________________________   INITIAL IMPRESSION / ASSESSMENT AND PLAN /  ED COURSE  Pertinent labs & imaging results that were available during my care of the patient were reviewed by me and considered in my medical decision making (see chart for details).  Patient reports proximally 9-[redacted] weeks pregnant. She has been experiencing intermittent chest discomfort which she describes as sharp in nature, no changes in exertion over the last 3  weeks. It comes and goes, and a present a symptomatic but it have chest pain earlier today while work. Her overall exam is very reassuring, she does have mild tachycardia. She has flown here from [place] approximately one week ago, though she reported her chest pain started previous to this.  The present time she appears stable, however given her sharp chest pain in pregnancy is a risk factor we will screen for pulmonary embolism, acute coronary syndrome, congestive heart failure. No signs or symptoms of ripping tearing or moving pain to suggest dissection. Her EKG is very reassuring.  I discussed carefully with the patient the risks and benefits of radiographs including x-ray and CT scan, and we did discuss possibility for congenital defects, miscarriage, or other complications of pregnancy due to x-ray and CT scan. The patient is agreeable with radiographs at this time after discussing low but not no risk of possible complications of x-ray/ct.  ----------------------------------------- 6:43 PM on 01/13/2015 -----------------------------------------  Patient reports her pain is resolved after codeine. Her heart rate is improved, currently heart rate in the mid 80s. She is fully awake and alert. Her pulse oximetry on my evaluation is in the high 90s from 97-98% on room air. Believe the document and 93% is likely spurious.  Patient verbally and signs consent for CT scan to rule out pulmonary embolism and further evaluated for chest pain.  ----------------------------------------- 8:14 PM on 01/13/2015 -----------------------------------------  Patient reports no pain or symptoms at this time. Discussed her CT results including follow-up for nodule. Discuss careful return precautions, and she will follow-up with cardiology early next week. Careful return precautions advised. Presley no evidence of acute coronary syndrome, congestive heart failure, cardiomyopathy, pulmonary wasn't, dissection, or other  acute or concerning emergent condition. No GYN symptoms.   ____________________________________________   FINAL CLINICAL IMPRESSION(S) / ED DIAGNOSES  Final diagnoses:  Chest pain  Pulmonary nodule      Sharyn Creamer, MD 01/13/15 2014  Sharyn Creamer, MD 01/13/15 2015

## 2015-01-13 NOTE — ED Notes (Signed)
Last pm had episodes of stabbing feeling in chest, felt like her heart rate would get fast, has had 3 episodes so far, 9 wks preg

## 2015-01-16 ENCOUNTER — Telehealth: Payer: Self-pay | Admitting: *Deleted

## 2015-01-16 NOTE — Telephone Encounter (Signed)
Spoke to patient about making Hospital fu she states she is now back in [place] and will find one near her.

## 2017-06-24 IMAGING — CR DG CHEST 2V
1 series · 2 of 2 positions shown · non-contrast
Comparison: None.

CLINICAL DATA: Chest pain and shortness of breath for 2 or 3 weeks.
Nine weeks pregnant. Subjective tachycardia. Initial encounter.

EXAM:
CHEST  2 VIEW

[Series 1: dg chest 2 view · 0.14mm/px · 2 of 2 slices shown]
[im 1/2]
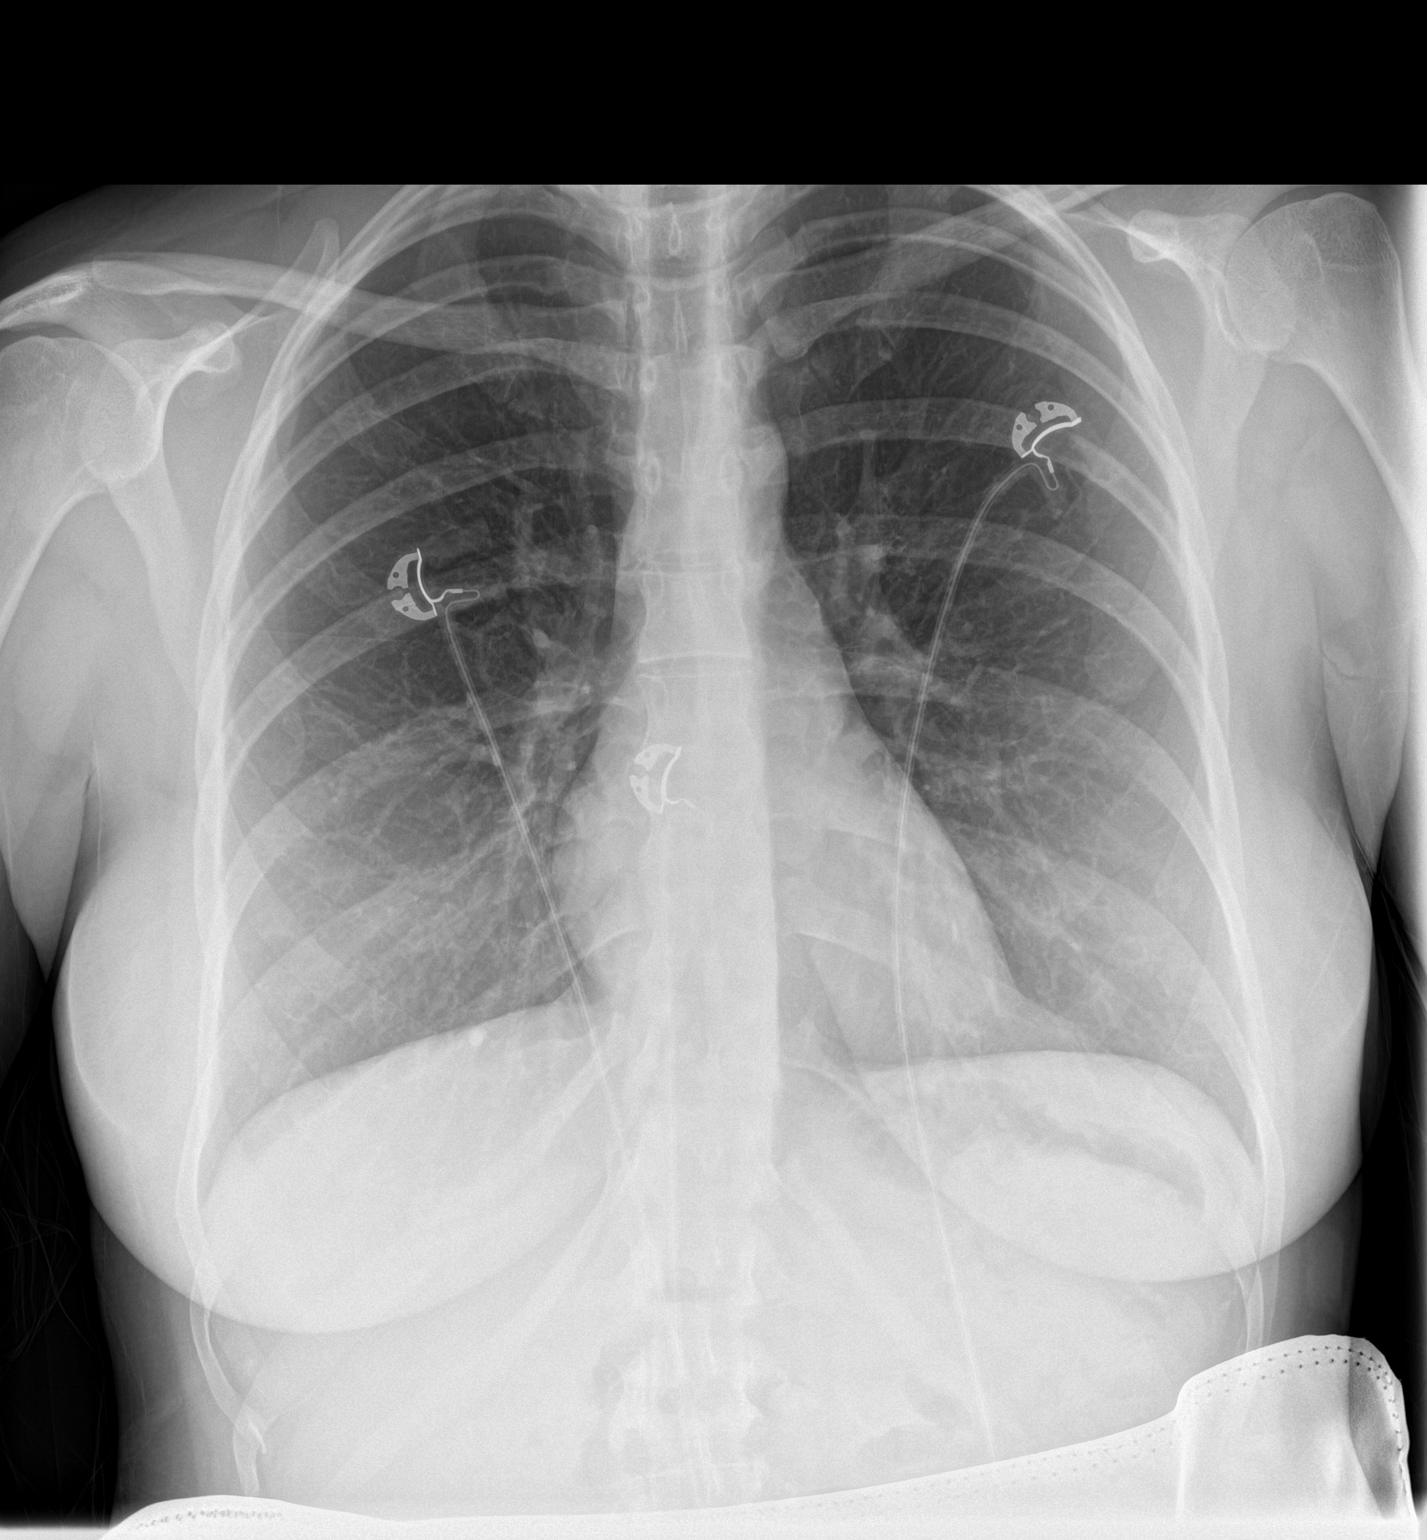
[im 2/2]
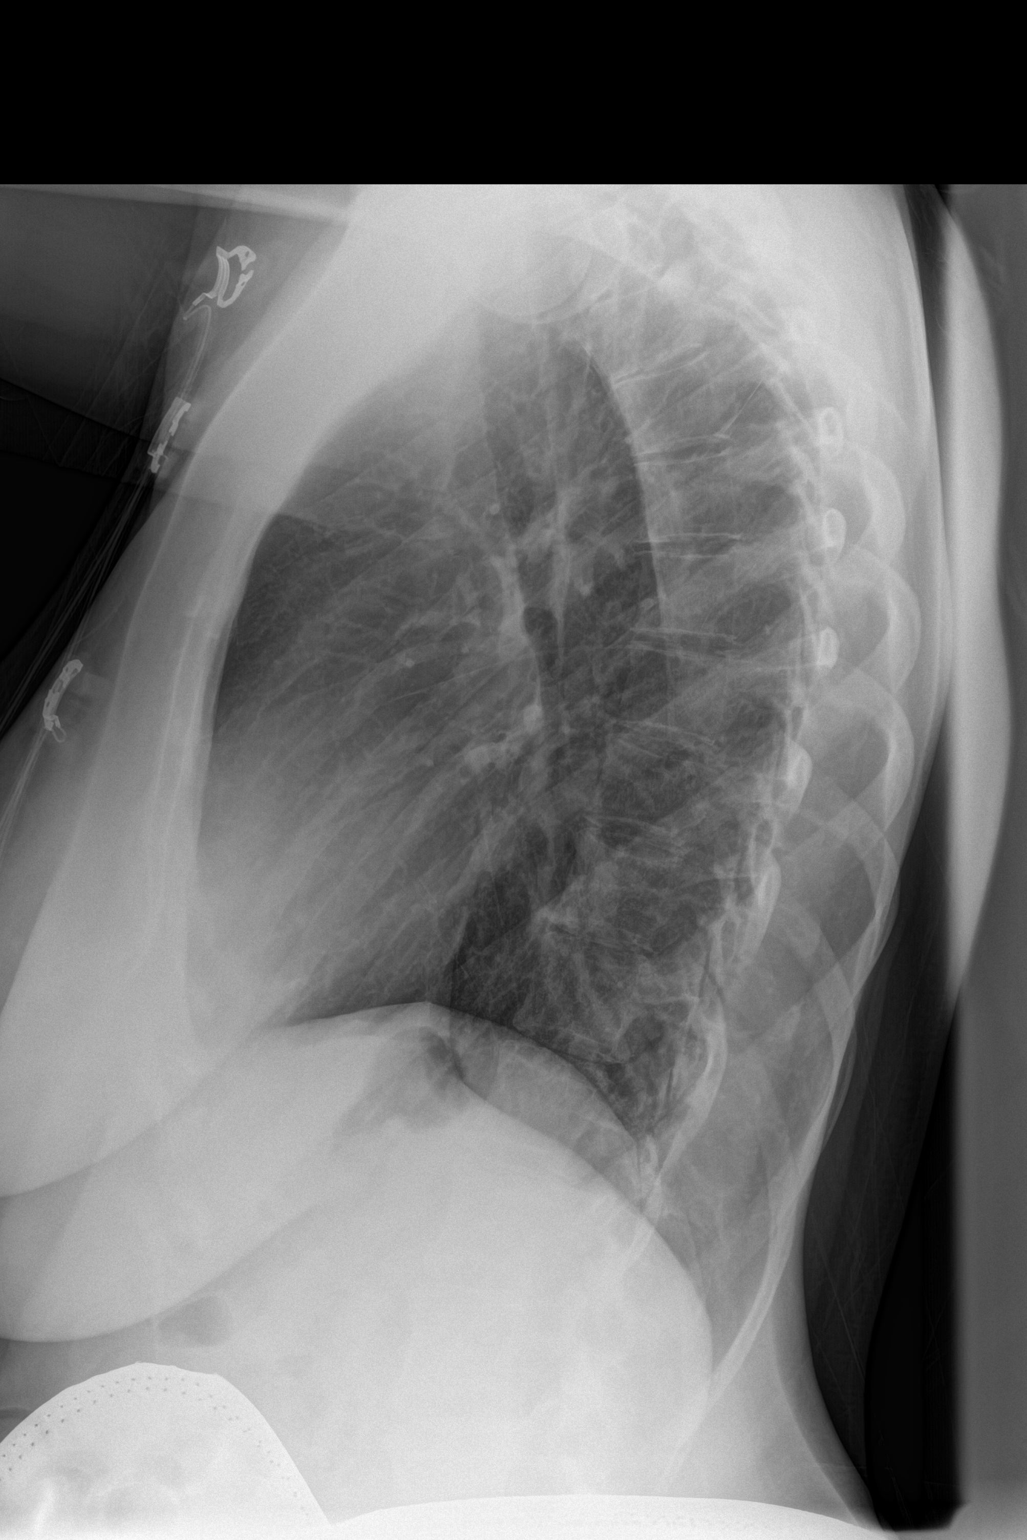

[2 of 2 positions shown; findings below may reference images not displayed]

FINDINGS: The heart size and mediastinal contours are normal. The lungs are
clear. There is no pleural effusion or pneumothorax. No acute
osseous findings are identified. Telemetry leads overlie the chest.
IMPRESSION: No active cardiopulmonary process.
